# Patient Record
Sex: Female | Born: 1985 | Race: White | Hispanic: No | Marital: Single | Smoking: Current every day smoker
Health system: Southern US, Community
[De-identification: ages and names within clinical notes are randomized; demographics above are authoritative.]

## PROBLEM LIST (undated history)

## (undated) DIAGNOSIS — N200 Calculus of kidney: Secondary | ICD-10-CM

## (undated) DIAGNOSIS — J020 Streptococcal pharyngitis: Secondary | ICD-10-CM

## (undated) HISTORY — PX: WISDOM TOOTH EXTRACTION: SHX21

---

## 2006-02-06 ENCOUNTER — Emergency Department (HOSPITAL_COMMUNITY): Admission: EM | Admit: 2006-02-06 | Discharge: 2006-02-06 | Payer: Self-pay | Admitting: Emergency Medicine

## 2014-03-17 ENCOUNTER — Emergency Department (HOSPITAL_COMMUNITY)
Admission: EM | Admit: 2014-03-17 | Discharge: 2014-03-17 | Disposition: A | Discharge status: Home / Self Care | Payer: Medicaid Other | Attending: Emergency Medicine | Admitting: Emergency Medicine

## 2014-03-17 ENCOUNTER — Encounter (HOSPITAL_COMMUNITY): Payer: Self-pay | Admitting: Emergency Medicine

## 2014-03-17 DIAGNOSIS — Z79899 Other long term (current) drug therapy: Secondary | ICD-10-CM | POA: Insufficient documentation

## 2014-03-17 DIAGNOSIS — Z720 Tobacco use: Secondary | ICD-10-CM | POA: Insufficient documentation

## 2014-03-17 DIAGNOSIS — J029 Acute pharyngitis, unspecified: Secondary | ICD-10-CM | POA: Insufficient documentation

## 2014-03-17 DIAGNOSIS — J020 Streptococcal pharyngitis: Secondary | ICD-10-CM

## 2014-03-17 HISTORY — DX: Streptococcal pharyngitis: J02.0

## 2014-03-17 LAB — RAPID STREP SCREEN (MED CTR MEBANE ONLY): STREPTOCOCCUS, GROUP A SCREEN (DIRECT): NEGATIVE

## 2014-03-17 LAB — MONONUCLEOSIS SCREEN: Mono Screen: NEGATIVE

## 2014-03-17 MED ORDER — IBUPROFEN 100 MG/5ML PO SUSP
ORAL | Status: AC
  Filled 2014-03-17: qty 25

## 2014-03-17 MED ORDER — HYDROCODONE-ACETAMINOPHEN 5-325 MG PO TABS: 1.0000 | ORAL_TABLET | ORAL | Status: DC | PRN

## 2014-03-17 MED ORDER — IBUPROFEN 100 MG/5ML PO SUSP
500.0000 mg | Freq: Once | ORAL | Status: AC
  Administered 2014-03-17: 500 mg via ORAL

## 2014-03-17 NOTE — Discharge Instructions (Signed)
Strep Throat °Strep throat is an infection of the throat caused by a bacteria named Streptococcus pyogenes. Your health care provider may call the infection streptococcal "tonsillitis" or "pharyngitis" depending on whether there are signs of inflammation in the tonsils or back of the throat. Strep throat is most common in children aged 29-15 years during the cold months of the year, but it can occur in people of any age during any season. This infection is spread from person to person (contagious) through coughing, sneezing, or other close contact. °SIGNS AND SYMPTOMS  °· Fever or chills. °· Painful, swollen, red tonsils or throat. °· Pain or difficulty when swallowing. °· White or yellow spots on the tonsils or throat. °· Swollen, tender lymph nodes or "glands" of the neck or under the jaw. °· Red rash all over the body (rare). °DIAGNOSIS  °Many different infections can cause the same symptoms. A test must be done to confirm the diagnosis so the right treatment can be given. A "rapid strep test" can help your health care provider make the diagnosis in a few minutes. If this test is not available, a light swab of the infected area can be used for a throat culture test. If a throat culture test is done, results are usually available in a day or two. °TREATMENT  °Strep throat is treated with antibiotic medicine. °HOME CARE INSTRUCTIONS  °· Gargle with 1 tsp of salt in 1 cup of warm water, 3-4 times per day or as needed for comfort. °· Family members who also have a sore throat or fever should be tested for strep throat and treated with antibiotics if they have the strep infection. °· Make sure everyone in your household washes their hands well. °· Do not share food, drinking cups, or personal items that could cause the infection to spread to others. °· You may need to eat a soft food diet until your sore throat gets better. °· Drink enough water and fluids to keep your urine clear or pale yellow. This will help prevent  dehydration. °· Get plenty of rest. °· Stay home from school, day care, or work until you have been on antibiotics for 24 hours. °· Take medicines only as directed by your health care provider. °· Take your antibiotic medicine as directed by your health care provider. Finish it even if you start to feel better. °SEEK MEDICAL CARE IF:  °· The glands in your neck continue to enlarge. °· You develop a rash, cough, or earache. °· You cough up green, yellow-brown, or bloody sputum. °· You have pain or discomfort not controlled by medicines. °· Your problems seem to be getting worse rather than better. °· You have a fever. °SEEK IMMEDIATE MEDICAL CARE IF:  °· You develop any new symptoms such as vomiting, severe headache, stiff or painful neck, chest pain, shortness of breath, or trouble swallowing. °· You develop severe throat pain, drooling, or changes in your voice. °· You develop swelling of the neck, or the skin on the neck becomes red and tender. °· You develop signs of dehydration, such as fatigue, dry mouth, and decreased urination. °· You become increasingly sleepy, or you cannot wake up completely. °MAKE SURE YOU: °· Understand these instructions. °· Will watch your condition. °· Will get help right away if you are not doing well or get worse. °Document Released: 02/27/2000 Document Revised: 07/16/2013 Document Reviewed: 04/30/2010 °[employer]® Patient Information ©2015 [employer]. This information is not intended to replace advice given to you by   your health care provider. Make sure you discuss any questions you have with your health care provider.   Finish your antibiotic as discussed as I suspect you do have strep throat and the antibiotic is working as your rapid strep screen today is negative.  Your exam findings strongly suggest that this is strep.  You may use the medication prescribed to help you with throat pain.  Do not drive within 4 hours of taking this as it will make you drowsy.  It also  contains Tylenol so do not take any additional Tylenol while taking this pain medication.  Rest and make sure you is drinking plenty of fluids get rechecked if you are not feeling improved over the next several days or for any worsened symptoms.

## 2014-03-17 NOTE — ED Notes (Signed)
Patient c/o severe sore throat. Per patient can't eat or drink due to the pain. Patient also reports some body aches and fevers. Hx of multiple strep infections. Patient seen at [facility] 4 days ago, was not swabbed for strep but given antibiotics-amoxicillin . Patient also reports taking extra strength tylenol with no relief.

## 2014-03-17 NOTE — ED Provider Notes (Signed)
CSN: [id number]     Arrival date & time 03/17/14  1258 History  This chart was scribed for non-physician practitioner, Burgess Amor, PA-C working with Doug Sou, MD by Gwenyth Ober, ED scribe. This patient was seen in room APFT21/APFT21 and the patient's care was started at 1:46 PM   Chief Complaint  Patient presents with  . Sore Throat   The history is provided by the patient. No language interpreter was used.    HPI Comments: Kristy Stokes is a 29 y.o. female who presents to the Emergency Department complaining of constant sore throat that started 1 week ago. She states fever of 100F, non productive cough  as associated symptoms. Pt also notes difficulty swallowing and states she has not eaten solid food in the last 3 days. Pt has history of strep throat. She went to [facility] 4 days ago, but did not have a strep test. Pt was prescribed Amoxicillin-500 mg, currently on day 3  with no relief. She also tried chloraseptic Spray and Tylenol for her symptoms. Pt's last dose of Tylenol was last night. Pt denies positive sick contact.  She denies nausea, vomiting, nasal congestion, ear pain and abdominal pain as associated symptoms.  No PCP  Past Medical History  Diagnosis Date  . Strep throat    History reviewed. No pertinent past surgical history. History reviewed. No pertinent family history. History  Substance Use Topics  . Smoking status: Current Every Day Smoker -- 1.00 packs/day for 10 years    Types: Cigarettes  . Smokeless tobacco: Never Used  . Alcohol Use: No   OB History    Gravida Para Term Preterm AB TAB SAB Ectopic Multiple Living   Review of Systems  Constitutional: Positive for fever and chills.  HENT: Positive for sore throat. Negative for congestion, ear pain, rhinorrhea, sinus pressure, trouble swallowing and voice change.   Eyes: Negative for discharge.  Respiratory: Positive for cough. Negative for shortness of breath, wheezing and stridor.    Cardiovascular: Negative for chest pain.  Gastrointestinal: Negative for nausea, vomiting and abdominal pain.  Genitourinary: Negative.   All other systems reviewed and are negative.   Allergies  Review of patient's allergies indicates no known allergies.  Home Medications   Prior to Admission medications   Medication Sig Start Date End Date Taking? Authorizing Provider  acetaminophen (TYLENOL) 500 MG tablet Take 1,500 mg by mouth every 6 (six) hours as needed for moderate pain.   Yes Historical Provider, MD  Cyanocobalamin (VITAMIN B 12 PO) Take 1 tablet by mouth daily.   Yes Historical Provider, MD  HYDROcodone-acetaminophen (NORCO/VICODIN) 5-325 MG per tablet Take 1 tablet by mouth every 4 (four) hours as needed for moderate pain. 03/17/14   Burgess Amor, PA-C   BP 118/76 mmHg  Pulse 97  Temp(Src) 100.5 F (38.1 C) (Oral)  Resp 24  Ht  (1.6 m)  Wt 121 lb (54.885 kg)  BMI 21.44 kg/m2  SpO2 100% Physical Exam  Constitutional: She is oriented to person, place, and time. She appears well-developed and well-nourished.  HENT:  Head: Normocephalic and atraumatic.  Right Ear: Tympanic membrane and ear canal normal.  Left Ear: Tympanic membrane and ear canal normal.  Nose: Mucosal edema and rhinorrhea present.  Mouth/Throat: Uvula is midline, oropharynx is clear and moist and mucous membranes are normal. No oropharyngeal exudate, posterior oropharyngeal edema, posterior oropharyngeal erythema or tonsillar abscesses.  1+  tonsills bilaterally; symmetric without abscess; erythema and moderate exudate; uvula midline; bilateral tonsillar adenopathy; TMs normal; no nasal congestion  Eyes: Conjunctivae are normal.  Neck: Phonation normal.  Cardiovascular: Normal rate and normal heart sounds.   Pulmonary/Chest: Effort normal. No stridor. No respiratory distress. She has no decreased breath sounds. She has no wheezes. She has no rhonchi. She has no rales.  Lungs clear  Abdominal: Soft.  There is no tenderness.  No hepatosplenomegaly  Musculoskeletal: Normal range of motion.  Neurological: She is alert and oriented to person, place, and time.  Skin: Skin is warm and dry. No rash noted.  Psychiatric: She has a normal mood and affect.  Nursing note and vitals reviewed.   ED Course  Procedures (including critical care time) DIAGNOSTIC STUDIES: Oxygen Saturation is 100% on RA, normal by my interpretation.    COORDINATION OF CARE: 1:55 PM Discussed treatment plan with pt at bedside and pt agreed to plan.    Labs Review Labs Reviewed  RAPID STREP SCREEN  CULTURE, GROUP A STREP  MONONUCLEOSIS SCREEN    Imaging Review No results found.   EKG Interpretation None      MDM   Final diagnoses:  Strep throat   Patients labs and/or radiological studies were viewed and considered during the medical decision making and disposition process.  Strep and mono tests negative. No tonsillar abscess, lungs clear, no stridor.  Pt stable.  Suspect this is strep throat which is responding to amoxil, given negative rapid strep. Mono negative.  encouraged continue abx, rest, increased fluid intake, hydrocodone for throat pain prescribed.  She tolerated PO fluids while here.   I personally performed the services described in this documentation, which was scribed in my presence. The recorded information has been reviewed and is accurate.   Burgess Amor, PA-C 03/18/14 1350  Doug Sou, MD 03/18/14 1444

## 2014-03-17 NOTE — ED Notes (Signed)
Pt verbalized understanding of no driving and to use caution within 4 hours of taking pain meds due to meds cause drowsiness 

## 2014-03-19 LAB — CULTURE, GROUP A STREP

## 2014-11-14 ENCOUNTER — Emergency Department (HOSPITAL_COMMUNITY): Payer: Medicaid Other

## 2014-11-14 ENCOUNTER — Emergency Department (HOSPITAL_COMMUNITY)
Admission: EM | Admit: 2014-11-14 | Discharge: 2014-11-14 | Disposition: A | Discharge status: Home / Self Care | Payer: Medicaid Other | Attending: Emergency Medicine | Admitting: Emergency Medicine

## 2014-11-14 ENCOUNTER — Encounter (HOSPITAL_COMMUNITY): Payer: Self-pay | Admitting: *Deleted

## 2014-11-14 DIAGNOSIS — Z720 Tobacco use: Secondary | ICD-10-CM | POA: Insufficient documentation

## 2014-11-14 DIAGNOSIS — Y998 Other external cause status: Secondary | ICD-10-CM | POA: Insufficient documentation

## 2014-11-14 DIAGNOSIS — Z8709 Personal history of other diseases of the respiratory system: Secondary | ICD-10-CM | POA: Diagnosis not present

## 2014-11-14 DIAGNOSIS — Z79899 Other long term (current) drug therapy: Secondary | ICD-10-CM | POA: Diagnosis not present

## 2014-11-14 DIAGNOSIS — S99922A Unspecified injury of left foot, initial encounter: Secondary | ICD-10-CM | POA: Diagnosis present

## 2014-11-14 DIAGNOSIS — W228XXA Striking against or struck by other objects, initial encounter: Secondary | ICD-10-CM | POA: Diagnosis not present

## 2014-11-14 DIAGNOSIS — Y9389 Activity, other specified: Secondary | ICD-10-CM | POA: Diagnosis not present

## 2014-11-14 DIAGNOSIS — S92415A Nondisplaced fracture of proximal phalanx of left great toe, initial encounter for closed fracture: Secondary | ICD-10-CM | POA: Diagnosis not present

## 2014-11-14 DIAGNOSIS — Y9289 Other specified places as the place of occurrence of the external cause: Secondary | ICD-10-CM | POA: Diagnosis not present

## 2014-11-14 DIAGNOSIS — S92912B Unspecified fracture of left toe(s), initial encounter for open fracture: Secondary | ICD-10-CM

## 2014-11-14 MED ORDER — IBUPROFEN 800 MG PO TABS
800.0000 mg | ORAL_TABLET | Freq: Once | ORAL | Status: DC
  Filled 2014-11-14: qty 1

## 2014-11-14 MED ORDER — IBUPROFEN 600 MG PO TABS: 600.0000 mg | ORAL_TABLET | Freq: Four times a day (QID) | ORAL | Status: DC | PRN

## 2014-11-14 MED ORDER — ACETAMINOPHEN 325 MG PO TABS
650.0000 mg | ORAL_TABLET | Freq: Once | ORAL | Status: AC
  Administered 2014-11-14: 650 mg via ORAL
  Filled 2014-11-14: qty 2

## 2014-11-14 MED ORDER — HYDROCODONE-ACETAMINOPHEN 5-325 MG PO TABS: 1.0000 | ORAL_TABLET | ORAL | Status: DC | PRN

## 2014-11-14 NOTE — ED Provider Notes (Signed)
CSN: [id number]     Arrival date & time 11/14/14  1352 History   First MD Initiated Contact with Patient 11/14/14 1441     Chief Complaint  Patient presents with  . Toe Injury     (Consider location/radiation/quality/duration/timing/severity/associated sxs/prior Treatment) HPI Comments: Patient is a 29 year old female who presents to the emergency department with complaint of left foot pain. The patient states that on Monday, August 29 she was trying to kick a basket of close and she missed an accidentally hit a wall. She heard a crack, and had immediate pain and swelling of her left first toe. She has had increasing pain and bruising since that time. The patient has tried conservative measures including elevation and some ice. She states the problem seems to be getting worse instead of better. It is really uncomfortable when she has to walk or stay in. She presents now for evaluation concerning this injury.  The history is provided by the patient.    Past Medical History  Diagnosis Date  . Strep throat    History reviewed. No pertinent past surgical history. No family history on file. Social History  Substance Use Topics  . Smoking status: Current Every Day Smoker -- 1.00 packs/day for 10 years    Types: Cigarettes  . Smokeless tobacco: Never Used  . Alcohol Use: No   OB History    Gravida Para Term Preterm AB TAB SAB Ectopic Multiple Living   1 1 1       1      Review of Systems  Constitutional: Negative for activity change.       All ROS Neg except as noted in HPI  HENT: Negative for nosebleeds.   Eyes: Negative for photophobia and discharge.  Respiratory: Negative for cough, shortness of breath and wheezing.   Cardiovascular: Negative for chest pain and palpitations.  Gastrointestinal: Negative for abdominal pain and blood in stool.  Genitourinary: Negative for dysuria, frequency and hematuria.  Musculoskeletal: Negative for back pain, arthralgias and neck pain.  Skin:  Negative.   Neurological: Negative for dizziness, seizures and speech difficulty.  Psychiatric/Behavioral: Negative for hallucinations and confusion.      Allergies  Review of patient's allergies indicates no known allergies.  Home Medications   Prior to Admission medications   Medication Sig Start Date End Date Taking? Authorizing Provider  diclofenac (VOLTAREN) 75 MG EC tablet Take 75 mg by mouth 2 (two) times daily.   Yes Historical Provider, MD  gabapentin (NEURONTIN) 300 MG capsule Take 300 mg by mouth 3 (three) times daily.   Yes Historical Provider, MD  acetaminophen (TYLENOL) 500 MG tablet Take 1,500 mg by mouth every 6 (six) hours as needed for moderate pain.    Historical Provider, MD  HYDROcodone-acetaminophen (NORCO/VICODIN) 5-325 MG per tablet Take 1 tablet by mouth every 4 (four) hours as needed for moderate pain. Patient not taking: Reported on 11/14/2014 03/17/14   Burgess Amor, PA-C   BP 121/82 mmHg  Pulse 66  Temp(Src) 97.8 F (36.6 C) (Oral)  Resp 16  Ht 5\' 3"  (1.6 m)  Wt 121 lb (54.885 kg)  BMI 21.44 kg/m2  SpO2 100%  LMP 11/14/2014 Physical Exam  Constitutional: She is oriented to person, place, and time. She appears well-developed and well-nourished.  Non-toxic appearance.  HENT:  Head: Normocephalic.  Right Ear: Tympanic membrane and external ear normal.  Left Ear: Tympanic membrane and external ear normal.  Eyes: EOM and lids are normal. Pupils are equal, round, and reactive  to light.  Neck: Normal range of motion. Neck supple. Carotid bruit is not present.  Cardiovascular: Normal rate, regular rhythm, normal heart sounds, intact distal pulses and normal pulses.   Pulmonary/Chest: Breath sounds normal. No respiratory distress.  Abdominal: Soft. Bowel sounds are normal. There is no tenderness. There is no guarding.  Musculoskeletal: Normal range of motion.  There is pain and swelling of the left first toe. Pain is worse at the proximal phalanx. The Achilles  tendon is intact. The dorsalis pedis pulses 2+. Is full range of motion of the left knee and hip.  Lymphadenopathy:       Head (right side): No submandibular adenopathy present.       Head (left side): No submandibular adenopathy present.    She has no cervical adenopathy.  Neurological: She is alert and oriented to person, place, and time. She has normal strength. No cranial nerve deficit or sensory deficit.  Skin: Skin is warm and dry.  Psychiatric: She has a normal mood and affect. Her speech is normal.  Nursing note and vitals reviewed.   ED Course  Procedures (including critical care time) Labs Review Labs Reviewed - No data to display  Imaging Review Dg Foot Complete Left  11/14/2014   CLINICAL DATA:  Stubbing injury left great toe against a wall on Monday.  EXAM: LEFT FOOT - COMPLETE 3+ VIEW  COMPARISON:  None.  FINDINGS: Subtle linear lucency along the medial base of the proximal phalanx great toe is present with some adjacent soft tissue swelling. The possibility of a nondisplaced fracture is raised. This lucency is not visible on the other two views.  Lisfranc joint alignment normal.  No other significant findings.  IMPRESSION: 1. Linear lucency along the medial base of the proximal phalanx great toe. This could be a secondary sign of nondisplaced fracture, but is not apparent on the other projections. Correlate with physical exam; if this spot is point tender then the likelihood of a nondisplaced fracture is increased.   Electronically Signed   By: Gaylyn Rong M.D.   On: 11/14/2014 14:29   I have personally reviewed and evaluated these images and lab results as part of my medical decision-making.   EKG Interpretation None      MDM  X-ray of the left foot reveals a lucent area along the medial base of the proximal phalanx of the great toe. There is question as to whether this could be a fracture.  The left first toe was examined, and this is also the area of greatest  pain. The patient will be treated as a fracture. The patient is referred to podiatry. The patient will be treated medically with Norco and ibuprofen.    Final diagnoses:  None    **I have reviewed nursing notes, vital signs, and all appropriate lab and imaging results for this patient.Ivery Quale, PA-C 11/14/14 1514  Benjiman Core, MD 11/15/14 1158

## 2014-11-14 NOTE — Discharge Instructions (Signed)
Please see the podiatrist (foot specialist) as soon as possible Toe Fracture A toe fracture is a break in the bone of a toe. It may take 6 to 8 weeks for the toe injury to heal. HOME CARE  "Buddy taping" is taping the broken toe to the toe next to it. Leave the toes taped together for at least 1 week or as told by your doctor. Change the tape after bathing. Always use a small piece of gauze or cotton between the toes when taping them together.  Put ice on the injured area.  Put ice in a plastic bag.  Place a towel between your skin and the bag.  Leave the ice on for 15-20 minutes, 03-04 times a day.  After the first 2 days, put heat on the injured area. Use heat for the next 2 to 3 days. Put a heating pad on the foot or soak the foot in warm water as told by your doctor.  Keep the foot raised (elevated) above the level of your heart.  Wear sturdy, supportive shoes. The shoes should not pinch the toes or fit tightly against the toes.  Use a cast shoe (if prescribed) if the foot is very puffy (swollen).  Use crutches if you have pain or it hurts too much to walk.  Only take medicine as told by your doctor.  Follow up with your doctor as told. GET HELP RIGHT AWAY IF:   There is pain or puffiness that is not helped by medicine.  The pain does not get better after 1 week.  The toe is cold when the others are warm.  The toe loses feeling (numb) or turns white.  The toe becomes hot and red (inflamed). MAKE SURE YOU:   Understand these instructions.  Will watch this condition.  Will get help right away if you are not doing well or get worse. Document Released: 08/18/2007 Document Revised: 05/24/2011 Document Reviewed: 07/25/2009 [facility] Patient Information 2015 [place], [place]. This information is not intended to replace advice given to you by your health care provider. Make sure you discuss any questions you have with your health care provider.

## 2014-11-14 NOTE — ED Notes (Signed)
Pt states she hit her left great toe against the wall on Monday. NAD.

## 2015-02-19 ENCOUNTER — Encounter (HOSPITAL_COMMUNITY): Payer: Self-pay | Admitting: Emergency Medicine

## 2015-02-19 ENCOUNTER — Emergency Department (HOSPITAL_COMMUNITY)
Admission: EM | Admit: 2015-02-19 | Discharge: 2015-02-20 | Discharge status: Against Medical Advice | Payer: Medicaid Other | Attending: Emergency Medicine | Admitting: Emergency Medicine

## 2015-02-19 DIAGNOSIS — Z3202 Encounter for pregnancy test, result negative: Secondary | ICD-10-CM | POA: Diagnosis not present

## 2015-02-19 DIAGNOSIS — Z8709 Personal history of other diseases of the respiratory system: Secondary | ICD-10-CM | POA: Insufficient documentation

## 2015-02-19 DIAGNOSIS — Z79899 Other long term (current) drug therapy: Secondary | ICD-10-CM | POA: Diagnosis not present

## 2015-02-19 DIAGNOSIS — F17210 Nicotine dependence, cigarettes, uncomplicated: Secondary | ICD-10-CM | POA: Diagnosis not present

## 2015-02-19 DIAGNOSIS — M545 Low back pain: Secondary | ICD-10-CM | POA: Diagnosis not present

## 2015-02-19 DIAGNOSIS — Z791 Long term (current) use of non-steroidal anti-inflammatories (NSAID): Secondary | ICD-10-CM | POA: Diagnosis not present

## 2015-02-19 DIAGNOSIS — R339 Retention of urine, unspecified: Secondary | ICD-10-CM | POA: Diagnosis not present

## 2015-02-19 DIAGNOSIS — R32 Unspecified urinary incontinence: Secondary | ICD-10-CM | POA: Diagnosis not present

## 2015-02-19 DIAGNOSIS — M549 Dorsalgia, unspecified: Secondary | ICD-10-CM

## 2015-02-19 DIAGNOSIS — R109 Unspecified abdominal pain: Secondary | ICD-10-CM | POA: Diagnosis present

## 2015-02-19 LAB — COMPREHENSIVE METABOLIC PANEL
ALBUMIN: 4.2 g/dL (ref 3.5–5.0)
ALK PHOS: 55 U/L (ref 38–126)
ALT: 36 U/L (ref 14–54)
AST: 32 U/L (ref 15–41)
Anion gap: 6 (ref 5–15)
BILIRUBIN TOTAL: 0.4 mg/dL (ref 0.3–1.2)
BUN: 11 mg/dL (ref 6–20)
CALCIUM: 9.2 mg/dL (ref 8.9–10.3)
CO2: 30 mmol/L (ref 22–32)
Chloride: 102 mmol/L (ref 101–111)
Creatinine, Ser: 0.7 mg/dL (ref 0.44–1.00)
GFR calc Af Amer: >60 mL/min (ref >60)
GLUCOSE: 95 mg/dL (ref 65–99)
POTASSIUM: 3.3 mmol/L — AB (ref 3.5–5.1)
Sodium: 138 mmol/L (ref 135–145)
TOTAL PROTEIN: 7.4 g/dL (ref 6.5–8.1)

## 2015-02-19 LAB — URINALYSIS, ROUTINE W REFLEX MICROSCOPIC
BILIRUBIN URINE: NEGATIVE
GLUCOSE, UA: NEGATIVE mg/dL
Hgb urine dipstick: NEGATIVE
KETONES UR: NEGATIVE mg/dL
LEUKOCYTES UA: NEGATIVE
Nitrite: NEGATIVE
PH: 6 (ref 5.0–8.0)
Protein, ur: NEGATIVE mg/dL

## 2015-02-19 LAB — CBC
HEMATOCRIT: 40.4 % (ref 36.0–46.0)
Hemoglobin: 14.3 g/dL (ref 12.0–15.0)
MCH: 31.5 pg (ref 26.0–34.0)
MCHC: 35.4 g/dL (ref 30.0–36.0)
MCV: 89 fL (ref 78.0–100.0)
PLATELETS: 163 10*3/uL (ref 150–400)
RBC: 4.54 MIL/uL (ref 3.87–5.11)
RDW: 12.2 % (ref 11.5–15.5)
WBC: 6.1 10*3/uL (ref 4.0–10.5)

## 2015-02-19 LAB — PREGNANCY, URINE: PREG TEST UR: NEGATIVE

## 2015-02-19 MED ORDER — POTASSIUM CHLORIDE CRYS ER 20 MEQ PO TBCR
40.0000 meq | EXTENDED_RELEASE_TABLET | Freq: Once | ORAL | Status: AC
  Administered 2015-02-20: 40 meq via ORAL
  Filled 2015-02-19: qty 2

## 2015-02-19 MED ORDER — HYDROCODONE-ACETAMINOPHEN 5-325 MG PO TABS: 1.0000 | ORAL_TABLET | ORAL | Status: DC | PRN

## 2015-02-19 MED ORDER — IBUPROFEN 800 MG PO TABS
800.0000 mg | ORAL_TABLET | Freq: Once | ORAL | Status: AC
  Administered 2015-02-20: 800 mg via ORAL
  Filled 2015-02-19: qty 1

## 2015-02-19 NOTE — ED Provider Notes (Signed)
By signing my name below, I, Soijett Blue, attest that this documentation has been prepared under the direction and in the presence of Enbridge EnergyKristen N Butler Vegh, DO. Electronically Signed: Soijett Blue, ED Scribe. 02/19/2015. 11:40 PM.  TIME SEEN: 11:19 PM  CHIEF COMPLAINT: Flank pain  HPI: HPI Comments: Kristy Stokes is a 29 y.o. female who presents to the Emergency Department complaining of throbbing/aching, intermittent lower back pain for the past 3-4 weeks. She states that pain was initially bilateral cross the lower back and worse with movement. Now it seems more located in the right lower back. She states she cannot pick up her son because of pain and has a hard time twisting. No known injury that she can remember. She also reports that symptoms started after she had a tooth pulled and was taking penicillin, ibuprofen and Vicodin for pain. States that she was "taking so much medication" that caused her to have urinary retention. She also reports she has had several episodes of urinary incontinence. No bowel incontinence. She has had numbness for the past several days along the right lateral thigh but no saddle anesthesia. Note weakness in her lower extremities. No prior history of back surgery, epidural injections. No fever. She is not diabetic or immunocompromised. Denies any IV drug use. No history of kidney stones. Denies any dysuria, hematuria, vaginal bleeding or discharge. She denies being on any other medications that may lead to urinary retention.   ROS: See HPI Constitutional: no fever  Eyes: no drainage  ENT: no runny nose   Cardiovascular:  no chest pain  Resp: no SOB  GI: no vomiting GU: no dysuria Integumentary: no rash  Allergy: no hives  Musculoskeletal: no leg swelling  Neurological: no slurred speech ROS otherwise negative  PAST MEDICAL HISTORY/PAST SURGICAL HISTORY:  Past Medical History  Diagnosis Date  . Strep throat     MEDICATIONS:  Prior to Admission medications    Medication Sig Start Date End Date Taking? Authorizing Provider  etonogestrel (NEXPLANON) 68 MG IMPL implant 1 each by Subdermal route once.   Yes Historical Provider, MD  acetaminophen (TYLENOL) 500 MG tablet Take 1,500 mg by mouth every 6 (six) hours as needed for moderate pain.    Historical Provider, MD  diclofenac (VOLTAREN) 75 MG EC tablet Take 75 mg by mouth 2 (two) times daily.    Historical Provider, MD  gabapentin (NEURONTIN) 300 MG capsule Take 300 mg by mouth 3 (three) times daily.    Historical Provider, MD  HYDROcodone-acetaminophen (NORCO/VICODIN) 5-325 MG per tablet Take 1 tablet by mouth every 4 (four) hours as needed. Patient not taking: Reported on 02/19/2015 11/14/14   Ivery QualeHobson Bryant, PA-C  ibuprofen (ADVIL,MOTRIN) 600 MG tablet Take 1 tablet (600 mg total) by mouth every 6 (six) hours as needed. Patient not taking: Reported on 02/19/2015 11/14/14   Ivery QualeHobson Bryant, PA-C    ALLERGIES:  No Known Allergies  SOCIAL HISTORY:  Social History  Substance Use Topics  . Smoking status: Current Every Day Smoker -- 1.00 packs/day for 10 years    Types: Cigarettes  . Smokeless tobacco: Never Used  . Alcohol Use: No    FAMILY HISTORY: History reviewed. No pertinent family history.  EXAM: BP 126/89 mmHg  Pulse 76  Temp(Src) 98 F (36.7 C) (Oral)  Resp 14  Ht 5\' 3"  (1.6 m)  Wt 130 lb (58.968 kg)  BMI 23.03 kg/m2  SpO2 99% CONSTITUTIONAL: Alert and oriented and responds appropriately to questions. Well-appearing; well-nourished, afebrile, nontoxic but  does appear uncomfortable  HEAD: Normocephalic EYES: Conjunctivae clear, PERRL ENT: normal nose; no rhinorrhea; moist mucous membranes; pharynx without lesions noted NECK: Supple, no meningismus, no LAD  CARD: RRR; S1 and S2 appreciated; no murmurs, no clicks, no rubs, no gallops RESP: Normal chest excursion without splinting or tachypnea; breath sounds clear and equal bilaterally; no wheezes, no rhonchi, no rales,  no hypoxia,  speaking full sentences  ABD/GI: Normal bowel sounds; non-distended; soft, non-tender, no rebound, no guarding BACK:  The back appears normal and itender throughout the lumbar paraspinal musculature and over the lower lumbar spine without step-off or deformity, no CVA tenderness on exam EXT: Normal ROM in all joints; non-tender to palpation; no edema; normal capillary refill; no cyanosis    SKIN: Normal color for age and race; warm NEURO: Moves all extremities equally, strength 5/5 in all 4 extremities, 2+ deep tendon reflexes in bilateral lower extremity is, no clonus, normal gait, no saddle anesthesia, patient does report decreased sensation to the right lateral thigh from the hip to the knee compared to the left side but otherwise normal sensation diffusely  PSYCH: The patient's mood and manner are appropriate. Grooming and personal hygiene are appropriate.  MEDICAL DECISION MAKING:Patient here with complaints of back pain. She briefly mentions urinary retention and urinary incontinence but states she thinks this was medication related. Discussed with patient that I do not think that ibuprofen, Vicodin or penicillin have been causing her urinary retention especially if and tender still present and she is no longer taking these medications. She also reports several episodes of urinary incontinence. I'm concerned that she could have caudae equina and have recommended that she be transported to [facility] for an emergent MRI. She is here with her young son and states that no one can calm pick him up and she cannot have this test performed tonight. She has had labs that show no abnormality other than mildly decreased potassium at 3.3. This has been replaced. Urine shows no bladder sign of infection. She is not pregnant. I do not think this is a UTI or kidney stone causing her symptoms. Discussed with patient that it is very concerning to have urinary retention and incontinence in the setting of back  pain that is new. She again states that she cannot stay for an MRI but will try to come back either to [facility] or [facility] tomorrow for an MRI of her lumbar spine. I think this can be performed without contrast. Have low suspicion for epidural abscess, discitis or osteomyelitis. Because patient refuses to have this imaging done today and I think this is emergent she will sign out AGAINST MEDICAL ADVICE. She is not intoxicated, is oriented 3 and has capacity to make this decision. She is able to verbalize risks back to me. We'll give her a dose of ibuprofen in the emergency department and discharge with prescription for 6 Vicodin that she can have pain control overnight but have discussed with her again at length that she needs to return to the hospital as soon as possible for an MRI as symptoms could worsen and could lead to persistent neurologic deficit, disability if she does have cauda equina, spinal stenosis. She verbalized understanding and is comfortable with this plan.  I personally performed the services described in this documentation, which was scribed in my presence. The recorded information has been reviewed and is accurate.    Layla Maw Locklyn Henriquez, DO 02/20/15 [phone]

## 2015-02-19 NOTE — Discharge Instructions (Signed)
You were seen in the ED for back pain, urinary incontinence and urinary retention.  These symptoms are very concerning for a surgical emergency called cauda equina.  You have decided to leave the ED without a MRI of your lumbar spine.  I recommend that you return ASAP for this test.  If you develop worsening symptoms or change your mind and want further evaluation, we are happy to take care of you.   Acute Urinary Retention, Female Acute urinary retention is the temporary inability to urinate. This is an uncommon problem in women. It can be caused by:  Infection.  A side effect of a medicine.  A problem in a nearby organ that presses or squeezes on the bladder or the urethra (the tube that drains the bladder).  Psychological problems.   Surgery on your bladder, urethra, or pelvic organs that causes obstruction to the outflow of urine from your bladder. HOME CARE INSTRUCTIONS  If you are sent home with a Foley catheter and a drainage system, you will need to discuss the best course of action with your health care provider. While the catheter is in, maintain a good intake of fluids. Keep the drainage bag emptied and lower than your catheter. This is so that contaminated urine will not flow back into your bladder, which could lead to a urinary tract infection. There are two main types of drainage bags. One is a large bag that usually is used at night. It has a good capacity that will allow you to sleep through the night without having to empty it. The second type is called a leg bag. It has a smaller capacity so it needs to be emptied more frequently. However, the main advantage is that it can be attached by a leg strap and goes underneath your clothing, allowing you the freedom to move about or leave your home. Only take over-the-counter or prescription medicines for pain, discomfort, or fever as directed by your health care provider.  SEEK MEDICAL CARE IF:  You develop a low-grade fever.  You  experience spasms or leakage of urine with the spasms. SEEK IMMEDIATE MEDICAL CARE IF:   You develop chills or fever.  Your catheter stops draining urine.  Your catheter falls out.  You start to develop increased bleeding that does not respond to rest and increased fluid intake. MAKE SURE YOU:  Understand these instructions.  Will watch your condition.  Will get help right away if you are not doing well or get worse.   This information is not intended to replace advice given to you by your health care provider. Make sure you discuss any questions you have with your health care provider.   Document Released: 02/28/2006 Document Revised: 07/16/2014 Document Reviewed: 08/10/2012 Elsevier Interactive Patient Education 2016 Elsevier Inc[employer]  Back Pain, Adult Back pain is very common in adults.The cause of back pain is rarely dangerous and the pain often gets better over time.The cause of your back pain may not be known. Some common causes of back pain include:  Strain of the muscles or ligaments supporting the spine.  Wear and tear (degeneration) of the spinal disks.  Arthritis.  Direct injury to the back. For many people, back pain may return. Since back pain is rarely dangerous, most people can learn to manage this condition on their own. HOME CARE INSTRUCTIONS Watch your back pain for any changes. The following actions may help to lessen any discomfort you are feeling:  Remain active. It is stressful on your  back to sit or stand in one place for long periods of time. Do not sit, drive, or stand in one place for more than 30 minutes at a time. Take short walks on even surfaces as soon as you are able.Try to increase the length of time you walk each day.  Exercise regularly as directed by your health care provider. Exercise helps your back heal faster. It also helps avoid future injury by keeping your muscles strong and flexible.  Do not stay in bed.Resting more than 1-2 days  can delay your recovery.  Pay attention to your body when you bend and lift. The most comfortable positions are those that put less stress on your recovering back. Always use proper lifting techniques, including:  Bending your knees.  Keeping the load close to your body.  Avoiding twisting.  Find a comfortable position to sleep. Use a firm mattress and lie on your side with your knees slightly bent. If you lie on your back, put a pillow under your knees.  Avoid feeling anxious or stressed.Stress increases muscle tension and can worsen back pain.It is important to recognize when you are anxious or stressed and learn ways to manage it, such as with exercise.  Take medicines only as directed by your health care provider. Over-the-counter medicines to reduce pain and inflammation are often the most helpful.Your health care provider may prescribe muscle relaxant drugs.These medicines help dull your pain so you can more quickly return to your normal activities and healthy exercise.  Apply ice to the injured area:  Put ice in a plastic bag.  Place a towel between your skin and the bag.  Leave the ice on for 20 minutes, 2-3 times a day for the first 2-3 days. After that, ice and heat may be alternated to reduce pain and spasms.  Maintain a healthy weight. Excess weight puts extra stress on your back and makes it difficult to maintain good posture. SEEK MEDICAL CARE IF:  You have pain that is not relieved with rest or medicine.  You have increasing pain going down into the legs or buttocks.  You have pain that does not improve in one week.  You have night pain.  You lose weight.  You have a fever or chills. SEEK IMMEDIATE MEDICAL CARE IF:   You develop new bowel or bladder control problems.  You have unusual weakness or numbness in your arms or legs.  You develop nausea or vomiting.  You develop abdominal pain.  You feel faint.   This information is not intended to  replace advice given to you by your health care provider. Make sure you discuss any questions you have with your health care provider.   Document Released: 03/01/2005 Document Revised: 03/22/2014 Document Reviewed: 07/03/2013 Elsevier Interactive Patient Education 2016 [employer].   Acknowledgement of Risk of Discharge  Against Medical Advice   And Release of Liability    Because I am choosing to leave the hospital in spite of these risks, I release the hospital, its employees and officers, and my attending physician from all liability for any adverse results caused by my leaving the hospital prematurely.   ________________________________      _____________________________________ Patient's Signature                                        Date and Time   ________________________________  _____________________________________ Witness' Signature                                        Relationship to Patient    ________________________________      _____________________________________ Witness' Signature                                        Relationship to Patient   [If patient refuses to sign, write "Patient refused to sign" on the patient's signature line and have witnesses to the refusal sign as witnesses.]

## 2015-02-19 NOTE — ED Notes (Signed)
Pt states she has been having right flank pain on and off for about 3 weeks, cloudy urine, some times has difficulty going urinate

## 2015-02-20 ENCOUNTER — Encounter (HOSPITAL_COMMUNITY): Payer: Self-pay | Admitting: *Deleted

## 2015-02-20 ENCOUNTER — Emergency Department (HOSPITAL_COMMUNITY)
Admission: EM | Admit: 2015-02-20 | Discharge: 2015-02-20 | Disposition: A | Discharge status: Home / Self Care | Payer: Medicaid Other | Source: Home / Self Care | Attending: Emergency Medicine | Admitting: Emergency Medicine

## 2015-02-20 ENCOUNTER — Emergency Department (HOSPITAL_COMMUNITY): Payer: Medicaid Other

## 2015-02-20 DIAGNOSIS — F17210 Nicotine dependence, cigarettes, uncomplicated: Secondary | ICD-10-CM

## 2015-02-20 DIAGNOSIS — Z8709 Personal history of other diseases of the respiratory system: Secondary | ICD-10-CM

## 2015-02-20 DIAGNOSIS — Z79899 Other long term (current) drug therapy: Secondary | ICD-10-CM | POA: Insufficient documentation

## 2015-02-20 DIAGNOSIS — Z791 Long term (current) use of non-steroidal anti-inflammatories (NSAID): Secondary | ICD-10-CM

## 2015-02-20 DIAGNOSIS — R339 Retention of urine, unspecified: Secondary | ICD-10-CM

## 2015-02-20 DIAGNOSIS — R52 Pain, unspecified: Secondary | ICD-10-CM

## 2015-02-20 DIAGNOSIS — M545 Low back pain: Secondary | ICD-10-CM

## 2015-02-20 MED ORDER — TRAMADOL HCL 50 MG PO TABS: 50.0000 mg | ORAL_TABLET | Freq: Four times a day (QID) | ORAL | Status: DC | PRN

## 2015-02-20 NOTE — ED Notes (Signed)
Pt here last night for lower back pain and urinary retention. Dr. Elesa MassedWard wanted pt to have MRI [facility] Pt stated she could not drive to [facility]. States she went to radiology, here at [facility] today but she states they did not have an order. Here for MRI.Noted in paperwork that pt left AMA last night.

## 2015-02-20 NOTE — Discharge Instructions (Signed)
Follow up with [facility] for urinary problems

## 2015-02-20 NOTE — ED Notes (Signed)
Patient verbalized understanding of discharge instructions, home care and AMA procedure. Patient states she will return if symptoms increase. Patient ambulatory out of department at this time

## 2015-02-20 NOTE — ED Provider Notes (Addendum)
CSN: [id number]     Arrival date & time 02/20/15  1437 History   First MD Initiated Contact with Patient 02/20/15 1503     Chief Complaint  Patient presents with  . Back Pain     (Consider location/radiation/quality/duration/timing/severity/associated sxs/prior Treatment) Patient is a 29 y.o. female presenting with back pain. The history is provided by the patient (Patient states` that she's been having some pain in her right leg and numbness in her left leg. This has gotten worse over last week but has been around for a long time. She was seen in emergency department last night and was recommended to get an MRI ).  Back Pain Location:  Lumbar spine Quality:  Aching Radiates to:  R posterior upper leg Pain severity:  Mild Onset quality:  Gradual Timing:  Constant Chronicity:  Recurrent Associated symptoms: no abdominal pain, no chest pain and no headaches     Past Medical History  Diagnosis Date  . Strep throat    History reviewed. No pertinent past surgical history. No family history on file. Social History  Substance Use Topics  . Smoking status: Current Every Day Smoker -- 1.00 packs/day for 10 years    Types: Cigarettes  . Smokeless tobacco: Never Used  . Alcohol Use: No   OB History    Gravida Para Term Preterm AB TAB SAB Ectopic Multiple Living   1 1 1       1      Review of Systems  Constitutional: Negative for appetite change and fatigue.  HENT: Negative for congestion, ear discharge and sinus pressure.   Eyes: Negative for discharge.  Respiratory: Negative for cough.   Cardiovascular: Negative for chest pain.  Gastrointestinal: Negative for abdominal pain and diarrhea.  Genitourinary: Negative for frequency and hematuria.       Urinary retention  Musculoskeletal: Positive for back pain.  Skin: Negative for rash.  Neurological: Negative for seizures and headaches.  Psychiatric/Behavioral: Negative for hallucinations.      Allergies  Review of patient's  allergies indicates no known allergies.  Home Medications   Prior to Admission medications   Medication Sig Start Date End Date Taking? Authorizing Provider  diclofenac (VOLTAREN) 75 MG EC tablet Take 75 mg by mouth 2 (two) times daily.   Yes Historical Provider, MD  etonogestrel (NEXPLANON) 68 MG IMPL implant 1 each by Subdermal route once.   Yes Historical Provider, MD  gabapentin (NEURONTIN) 300 MG capsule Take 300 mg by mouth 3 (three) times daily.   Yes Historical Provider, MD  HYDROcodone-acetaminophen (NORCO/VICODIN) 5-325 MG tablet Take 1 tablet by mouth every 4 (four) hours as needed. 02/19/15  Yes Kristen N Ward, DO  traMADol (ULTRAM) 50 MG tablet Take 1 tablet (50 mg total) by mouth every 6 (six) hours as needed. 02/20/15   Bethann BerkshireJoseph Yisroel Mullendore, MD   BP 133/83 mmHg  Pulse 82  Temp(Src) 97.6 F (36.4 C) (Oral)  Resp 16  Ht 5\' 3"  (1.6 m)  Wt 130 lb (58.968 kg)  BMI 23.03 kg/m2  SpO2 100%  LMP  Physical Exam  Constitutional: She is oriented to person, place, and time. She appears well-developed.  HENT:  Head: Normocephalic.  Eyes: Conjunctivae and EOM are normal. No scleral icterus.  Neck: Neck supple. No thyromegaly present.  Cardiovascular: Normal rate and regular rhythm.  Exam reveals no gallop and no friction rub.   No murmur heard. Pulmonary/Chest: No stridor. She has no wheezes. She has no rales. She exhibits no tenderness.  Abdominal:  She exhibits no distension. There is no tenderness. There is no rebound.  Musculoskeletal: Normal range of motion. She exhibits tenderness. She exhibits no edema.  Tender lumbar spine  Lymphadenopathy:    She has no cervical adenopathy.  Neurological: She is oriented to person, place, and time. She exhibits normal muscle tone. Coordination normal.  Negative straight leg raise  Skin: No rash noted. No erythema.  Psychiatric: She has a normal mood and affect. Her behavior is normal.    ED Course  Procedures (including critical care  time) Labs Review Labs Reviewed - No data to display  Imaging Review Mr Lumbar Spine Wo Contrast  02/20/2015  CLINICAL DATA:  Low back pain.  No known injury. EXAM: MRI LUMBAR SPINE WITHOUT CONTRAST TECHNIQUE: Multiplanar, multisequence MR imaging of the lumbar spine was performed. No intravenous contrast was administered. COMPARISON:  Lumbar spine radiographs 10/02/2014. FINDINGS: Segmentation: Conventional anatomy assumed, with the last open disc space designated L5-S1. Alignment:  Normal. Bones: No worrisome osseous lesion, acute fracture or pars defect. Conus medullaris: Extends to the L1-2 level and appears normal. Paraspinal and other soft tissues: No significant paraspinal findings. Disc levels: All of the lumbar discs are well hydrated with maintained height. There is no disc herniation, spinal stenosis or nerve root encroachment. The foramina are widely patent. IMPRESSION: Normal lumbar MRI. Electronically Signed   By: Carey Bullocks M.D.   On: 02/20/2015 17:33   I have personally reviewed and evaluated these images and lab results as part of my medical decision-making.   EKG Interpretation None      MDM   Final diagnoses:  Pain  Urinary retention    Patient with back pain and urinary retention. Normal labs yesterday. She had MRI of her lumbar spine which was normal today. She'll be referred to urology for evaluation of this urinary retention. She will also follow-up with her primary doctor as needed   Bethann Berkshire, MD 02/20/15 1803  Bethann Berkshire, MD 03/06/15 1538

## 2015-02-25 MED FILL — Hydrocodone-Acetaminophen Tab 5-325 MG: ORAL | Qty: 6 | Status: AC

## 2015-04-05 ENCOUNTER — Emergency Department (HOSPITAL_COMMUNITY)
Admission: EM | Admit: 2015-04-05 | Discharge: 2015-04-05 | Disposition: A | Discharge status: Home / Self Care | Payer: Medicaid Other | Attending: Emergency Medicine | Admitting: Emergency Medicine

## 2015-04-05 ENCOUNTER — Encounter (HOSPITAL_COMMUNITY): Payer: Self-pay | Admitting: *Deleted

## 2015-04-05 DIAGNOSIS — F17210 Nicotine dependence, cigarettes, uncomplicated: Secondary | ICD-10-CM | POA: Insufficient documentation

## 2015-04-05 DIAGNOSIS — Y9389 Activity, other specified: Secondary | ICD-10-CM | POA: Insufficient documentation

## 2015-04-05 DIAGNOSIS — J3489 Other specified disorders of nose and nasal sinuses: Secondary | ICD-10-CM | POA: Insufficient documentation

## 2015-04-05 DIAGNOSIS — T1592XA Foreign body on external eye, part unspecified, left eye, initial encounter: Secondary | ICD-10-CM

## 2015-04-05 DIAGNOSIS — X58XXXA Exposure to other specified factors, initial encounter: Secondary | ICD-10-CM | POA: Diagnosis not present

## 2015-04-05 DIAGNOSIS — Y9289 Other specified places as the place of occurrence of the external cause: Secondary | ICD-10-CM | POA: Diagnosis not present

## 2015-04-05 DIAGNOSIS — S0591XA Unspecified injury of right eye and orbit, initial encounter: Secondary | ICD-10-CM | POA: Diagnosis present

## 2015-04-05 DIAGNOSIS — Z79899 Other long term (current) drug therapy: Secondary | ICD-10-CM | POA: Insufficient documentation

## 2015-04-05 DIAGNOSIS — Z791 Long term (current) use of non-steroidal anti-inflammatories (NSAID): Secondary | ICD-10-CM | POA: Diagnosis not present

## 2015-04-05 DIAGNOSIS — Y998 Other external cause status: Secondary | ICD-10-CM | POA: Diagnosis not present

## 2015-04-05 MED ORDER — TETRACAINE HCL 0.5 % OP SOLN
OPHTHALMIC | Status: AC
  Filled 2015-04-05: qty 4

## 2015-04-05 MED ORDER — TETRACAINE HCL 0.5 % OP SOLN
1.0000 [drp] | Freq: Once | OPHTHALMIC | Status: AC
  Administered 2015-04-05: 16:00:00 via OPHTHALMIC

## 2015-04-05 MED ORDER — ERYTHROMYCIN 5 MG/GM OP OINT
TOPICAL_OINTMENT | Freq: Once | OPHTHALMIC | Status: AC
  Administered 2015-04-05: 17:00:00 via OPHTHALMIC
  Filled 2015-04-05: qty 3.5

## 2015-04-05 NOTE — ED Notes (Signed)
Pt's L. Eye flushing with 250ccNS using Nona Dell per orders of J Idol,PA-C.

## 2015-04-05 NOTE — ED Notes (Signed)
Pt comes in with left eye pain. States she woke up with drainage. Pt has increased pain when she opens her eye.

## 2015-04-05 NOTE — Discharge Instructions (Signed)
Eye Foreign Body A foreign body refers to any object on the surface of the eye or in the eyeball that should not be there. A foreign body may be a small speck of dirt or dust, a hair or eyelash, a splinter, or any other object.  SIGNS AND SYMPTOMS Symptoms depend on what the foreign body is and where it is in the eye. The most common locations are:   On the inner surface of the upper or lower eyelids or on the covering of the white part of the eye (conjunctiva). Symptoms in this location are:  Pain and irritation, especially when blinking.  The feeling that something is in the eye.  On the surface of the clear covering on the front of the eye (cornea). Symptoms in this location include:  Pain and irritation.   Small "rust rings" around a metallic foreign body.  The feeling that something is in the eye.   Inside the eyeball. Foreign bodies inside the eye may cause:   Great pain.   Immediate loss of vision.   Distortion of the pupil. DIAGNOSIS  Foreign bodies are found during an exam by an eye specialist. Those on the eyelids, conjunctiva, or cornea are usually (but not always) easily found. When a foreign body is inside the eyeball, a cloudiness of the lens (cataract) may form almost right away. This makes it hard for an eye specialist to find the foreign body. Tests may be needed, including ultrasound testing, X-rays, and CT scans. TREATMENT   Foreign bodies on the eyelids, conjunctiva, or cornea are often removed easily and painlessly.  Rust in the cornea may require the use of a drill-like instrument to remove the rust.  If the foreign body has caused a scratch or a rubbing or scraping (abrasion) of the cornea, this may be treated with antibiotic drops or ointment. A pressure patch may be put over your eye.  If the foreign body is inside your eyeball, surgery is needed right away. This is a medical emergency. Foreign bodies inside the eye threaten vision. A person may even  lose his or her eye. HOME CARE INSTRUCTIONS   Take medicines only as directed by your health care provider. Use eye drops or ointment as directed.  If no eye patch was applied:  Keep your eye closed as much as possible.  Do not rub your eye.  Wear dark glasses as needed to protect your eyes from bright light.  Do not wear contact lenses until your eye feels normal again, or as instructed by your health care provider.  Wear a protective eye covering if there is a risk of eye injury. This is important when working with high-speed tools.  If your eye is patched:  Follow your health care provider's instructions for when to remove the patch.  Do notdrive or operate machinery if your eye is patched. Your ability to judge distances is impaired.  Keep all follow-up visits as directed by your health care provider. This is important. SEEK MEDICAL CARE IF:   You have increased pain in your eye.  Your vision gets worse.   You have problems with your eye patch.   You have fluid (discharge) coming from your injured eye.   You have redness and swelling around your affected eye.  MAKE SURE YOU:   Understand these instructions.  Will watch your condition.  Will get help right away if you are not doing well or get worse.   This information is not intended to  replace advice given to you by your health care provider. Make sure you discuss any questions you have with your health care provider.   Document Released: 03/01/2005 Document Revised: 03/22/2014 Document Reviewed: 07/27/2012 Elsevier Interactive Patient Education 2016 [employer].   Apply a small ribbon of the erythromycin ointment to your eye every 6 hours while awake.  Call Dr. Lita Mains for a recheck of your eye if it is not improving over the next several days.

## 2015-04-05 NOTE — ED Notes (Signed)
Pt given antibiotic eye ointment and instructions for use at discharge.

## 2015-04-08 NOTE — ED Provider Notes (Signed)
CSN: [id number]     Arrival date & time 04/05/15  1313 History   First MD Initiated Contact with Patient 04/05/15 1444     Chief Complaint  Patient presents with  . Eye Pain     (Consider location/radiation/quality/duration/timing/severity/associated sxs/prior Treatment) The history is provided by the patient.   Kristy Stokes is a 30 y.o. female  Who does not wear contacts or glasses presenting with left eye pain and foreign body sensation.  She endorses frequent episodes of similar symptoms, often lasting for 24 hours then resolves spontaneously.  This episode started this am, describes foreign body sensation beneath her left medial upper eyelid.  She denies visual changes except for mild photophobia.  There has been clear tearing, no thick discharge.  She has tried to flush her eye without relief.     Past Medical History  Diagnosis Date  . Strep throat    History reviewed. No pertinent past surgical history. No family history on file. Social History  Substance Use Topics  . Smoking status: Current Every Day Smoker -- 1.00 packs/day for 10 years    Types: Cigarettes  . Smokeless tobacco: Never Used  . Alcohol Use: No   OB History    Gravida Para Term Preterm AB TAB SAB Ectopic Multiple Living   Review of Systems  Constitutional: Negative for fever.  HENT: Negative for congestion and sore throat.   Eyes: Positive for photophobia and pain. Negative for visual disturbance.  Gastrointestinal: Negative for nausea.  Genitourinary: Negative.   Skin: Negative.  Negative for rash.  Neurological: Negative for dizziness, weakness, light-headedness, numbness and headaches.  Psychiatric/Behavioral: Negative.       Allergies  Tramadol  Home Medications   Prior to Admission medications   Medication Sig Start Date End Date Taking? Authorizing Provider  diclofenac (VOLTAREN) 75 MG EC tablet Take 75 mg by mouth 2 (two) times daily.   Yes Historical  Provider, MD  etonogestrel (NEXPLANON) 68 MG IMPL implant 1 each by Subdermal route once.   Yes Historical Provider, MD  gabapentin (NEURONTIN) 300 MG capsule Take 300 mg by mouth 3 (three) times daily.   Yes Historical Provider, MD   BP 130/85 mmHg  Pulse 80  Temp(Src) 97.7 F (36.5 C) (Oral)  Resp 16  Ht  (1.6 m)  Wt 57.153 kg  BMI 22.33 kg/m2  SpO2 100% Physical Exam  Constitutional: She is oriented to person, place, and time. She appears well-developed and well-nourished.  HENT:  Head: Normocephalic and atraumatic.  Right Ear: Tympanic membrane and ear canal normal.  Left Ear: Tympanic membrane and ear canal normal.  Nose: Mucosal edema and rhinorrhea present.  Mouth/Throat: Uvula is midline, oropharynx is clear and moist and mucous membranes are normal. No oropharyngeal exudate, posterior oropharyngeal edema, posterior oropharyngeal erythema or tonsillar abscesses.  Eyes: EOM and lids are normal. Pupils are equal, round, and reactive to light. Lids are everted and swept, no foreign bodies found. Left eye exhibits no discharge. No foreign body present in the left eye. Left conjunctiva is injected. Left eye exhibits normal extraocular motion and no nystagmus.  Slit lamp exam:      The left eye shows no corneal abrasion, no corneal flare, no corneal ulcer, no foreign body, no hyphema, no fluorescein uptake and no anterior chamber bulge.  Visual Acuity - Bilateral Near: 20 ; Bilateral Distance: 25 ; R Near: 20 ; R  Distance: 30 ; L Near: 20 ; L Distance: 30  Cardiovascular: Normal rate and normal heart sounds.   Pulmonary/Chest: Effort normal. No respiratory distress. She has no wheezes. She has no rales.  Abdominal: Soft. There is no tenderness.  Musculoskeletal: Normal range of motion.  Neurological: She is alert and oriented to person, place, and time.  Skin: Skin is warm and dry. No rash noted.  Psychiatric: She has a normal mood and affect.    ED Course  Procedures  (including critical care time) Labs Review Labs Reviewed - No data to display  Imaging Review No results found. I have personally reviewed and evaluated these images and lab results as part of my medical decision-making.   EKG Interpretation None      MDM   Final diagnoses:  Eye foreign body, left, initial encounter    No fb, no abrasion on exam.  Pt's eye was flushed with NS with some but not complete resolution of pain.  She was given erythromycin ointment for pain relief and to protect the eye.  She was referred to Dr. Lita Mains for f/u care if sx persist.  Recommended f/u regardless of this episodes progression, given chronic intermittent sx.    Burgess Amor, PA-C 04/08/15 1254  Glynn Octave, MD 04/08/15 [phone]

## 2015-12-07 ENCOUNTER — Encounter (HOSPITAL_COMMUNITY): Payer: Self-pay | Admitting: Emergency Medicine

## 2015-12-07 ENCOUNTER — Emergency Department (HOSPITAL_COMMUNITY)
Admission: EM | Admit: 2015-12-07 | Discharge: 2015-12-07 | Disposition: A | Discharge status: Home / Self Care | Payer: Medicaid Other | Attending: Emergency Medicine | Admitting: Emergency Medicine

## 2015-12-07 DIAGNOSIS — K029 Dental caries, unspecified: Secondary | ICD-10-CM | POA: Insufficient documentation

## 2015-12-07 DIAGNOSIS — F17210 Nicotine dependence, cigarettes, uncomplicated: Secondary | ICD-10-CM | POA: Diagnosis not present

## 2015-12-07 DIAGNOSIS — K0889 Other specified disorders of teeth and supporting structures: Secondary | ICD-10-CM

## 2015-12-07 MED ORDER — NAPROXEN 250 MG PO TABS: 250.0000 mg | ORAL_TABLET | Freq: Two times a day (BID) | ORAL | 0 refills | Status: DC | PRN

## 2015-12-07 MED ORDER — PENICILLIN V POTASSIUM 250 MG PO TABS: 250.0000 mg | ORAL_TABLET | Freq: Four times a day (QID) | ORAL | 0 refills | Status: DC

## 2015-12-07 NOTE — ED Triage Notes (Signed)
Pt reports L upper jaw dental pain. Pt states she is scheduled for dental procedure but the tooth has started breaking off.

## 2015-12-07 NOTE — Discharge Instructions (Signed)
Take the prescriptions as directed.  Call your regular dentist tomorrow to schedule a follow up appointment this week.  Return to the Emergency Department immediately sooner if worsening.

## 2015-12-07 NOTE — ED Provider Notes (Signed)
[facility]PT Provider Note   CSN: [id number] Arrival date & time: 12/07/15  1809     History   Chief Complaint Chief Complaint  Patient presents with  . Dental Pain    HPI Kristy Stokes is a 30 y.o. female.   Dental Pain      Pt was seen at 1940. Per pt, c/o gradual onset and persistence of constant left upper tooth "pain" for the past several weeks. States she "thinks I have a cavity in between the teeth because when I floss pieces of the tooth look like they're breaking off."  Denies fevers, no intra-oral edema, no rash, no facial swelling, no dysphagia, no neck pain.   The condition is aggravated by nothing. The condition is relieved by nothing. The symptoms have been associated with no other complaints. The patient has no significant history of serious medical conditions.    Past Medical History:  Diagnosis Date  . Strep throat     There are no active problems to display for this patient.   History reviewed. No pertinent surgical history.  OB History    Gravida Para Term Preterm AB Living   1 1 1     1    SAB TAB Ectopic Multiple Live Births                   Home Medications    Prior to Admission medications   Medication Sig Start Date End Date Taking? Authorizing Provider  diclofenac (VOLTAREN) 75 MG EC tablet Take 75 mg by mouth 2 (two) times daily.    Historical Provider, MD  etonogestrel (NEXPLANON) 68 MG IMPL implant 1 each by Subdermal route once.    Historical Provider, MD  gabapentin (NEURONTIN) 300 MG capsule Take 300 mg by mouth 3 (three) times daily.    Historical Provider, MD    Family History History reviewed. No pertinent family history.  Social History Social History  Substance Use Topics  . Smoking status: Current Every Day Smoker    Packs/day: 1.00    Years: 10.00    Types: Cigarettes  . Smokeless tobacco: Never Used  . Alcohol use No     Allergies   Tramadol   Review of Systems Review of Systems ROS: Statement:  All systems negative except as marked or noted in the HPI; Constitutional: Negative for fever and chills. ; ; Eyes: Negative for eye pain and discharge. ; ; ENMT: Positive for dental caries, dental hygiene poor and toothache. Negative for ear pain, bleeding gums, dental injury, facial deformity, facial swelling, hoarseness, nasal congestion, sinus pressure, sore throat, throat swelling and tongue swollen. ; ; Cardiovascular: Negative for chest pain, palpitations, diaphoresis, dyspnea and peripheral edema. ; ; Respiratory: Negative for cough, wheezing and stridor. ; ; Gastrointestinal: Negative for nausea, vomiting, diarrhea and abdominal pain. ; ; Genitourinary: Negative for dysuria, flank pain and hematuria. ; ; Musculoskeletal: Negative for back pain and neck pain. ; ; Skin: Negative for rash and skin lesion. ; ; Neuro: Negative for headache, lightheadedness and neck stiffness.      Physical Exam Updated Vital Signs BP 131/84 (BP Location: Right Arm)   Pulse 80   Temp 99.4 F (37.4 C) (Temporal)   Resp 16   Ht 5\' 3"  (1.6 m)   Wt 125 lb (56.7 kg)   SpO2 100%   BMI 22.14 kg/m   Physical Exam 1945: Physical examination: Vital signs and O2 SAT: Reviewed; Constitutional: Well developed, Well nourished, Well hydrated, In no  acute distress; Head and Face: Normocephalic, Atraumatic; Eyes: EOMI, PERRL, No scleral icterus; ENMT: Mouth and pharynx normal, Poor dentition, Widespread dental decay, Left TM normal, Right TM normal, Mucous membranes moist, +upper left 1st molar with dental decay.  No gingival erythema, edema, fluctuance, or drainage.  No intra-oral edema. No submandibular or sublingual edema. No hoarse voice, no drooling, no stridor. No trismus. ; Neck: Supple, Full range of motion, No lymphadenopathy; Cardiovascular: Regular rate and rhythm, No murmur, rub, or gallop; Respiratory: Breath sounds clear & equal bilaterally, No rales, rhonchi, wheezes, Normal respiratory effort/excursion; Chest:  Nontender, Movement normal; Extremities: Pulses normal, No tenderness, No edema; Neuro: AA&Ox3, Major CN grossly intact.  No gross focal motor or sensory deficits in extremities.; Skin: Color normal, No rash, No petechiae, Warm, Dry   ED Treatments / Results  Labs (all labs ordered are listed, but only abnormal results are displayed)   EKG  EKG Interpretation None       Radiology   Procedures Procedures (including critical care time)  Medications Ordered in ED Medications - No data to display   Initial Impression / Assessment and Plan / ED Course  I have reviewed the triage vital signs and the nursing notes.  Pertinent labs & imaging results that were available during my care of the patient were reviewed by me and considered in my medical decision making (see chart for details).  MDM Reviewed: previous chart, nursing note and vitals   1945:  Pt encouraged to f/u with dentist or oral surgeon for her dental needs for good continuity of care and definitive treatment.  Pt verb understanding.    Final Clinical Impressions(s) / ED Diagnoses   Final diagnoses:  None    New Prescriptions New Prescriptions   No medications on file      Samuel Jester, DO 12/08/15 1723

## 2020-04-24 ENCOUNTER — Other Ambulatory Visit: Payer: Self-pay

## 2020-04-24 ENCOUNTER — Emergency Department (HOSPITAL_COMMUNITY): Payer: Medicaid Other

## 2020-04-24 ENCOUNTER — Emergency Department (HOSPITAL_COMMUNITY)
Admission: EM | Admit: 2020-04-24 | Discharge: 2020-04-24 | Disposition: A | Discharge status: Home / Self Care | Payer: Medicaid Other | Attending: Emergency Medicine | Admitting: Emergency Medicine

## 2020-04-24 ENCOUNTER — Encounter (HOSPITAL_COMMUNITY): Payer: Self-pay | Admitting: *Deleted

## 2020-04-24 DIAGNOSIS — R0781 Pleurodynia: Secondary | ICD-10-CM

## 2020-04-24 DIAGNOSIS — S0990XA Unspecified injury of head, initial encounter: Secondary | ICD-10-CM | POA: Diagnosis not present

## 2020-04-24 DIAGNOSIS — F17210 Nicotine dependence, cigarettes, uncomplicated: Secondary | ICD-10-CM | POA: Diagnosis not present

## 2020-04-24 DIAGNOSIS — M25421 Effusion, right elbow: Secondary | ICD-10-CM

## 2020-04-24 DIAGNOSIS — M79641 Pain in right hand: Secondary | ICD-10-CM | POA: Diagnosis not present

## 2020-04-24 MED ORDER — IBUPROFEN 800 MG PO TABS: 800.0000 mg | ORAL_TABLET | Freq: Once | ORAL | Status: DC
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
## 2020-04-24 NOTE — ED Notes (Signed)
Pt sat up in bed, repositioned, lights turned on and noise provided to assist in continued stimuli. When awake, patient is alert and oriented, but drifts back to sleep while speaking with this RN. Respirations are even and unlabored with equal chest rise and fall. Bed is locked in the lowest position, side rails x2, call bell within reach.

## 2020-04-24 NOTE — ED Notes (Addendum)
Patient transported to CT.  6:13 PM Pt returned from CT and x-ray at this time.
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
## 2020-04-24 NOTE — ED Notes (Signed)
ED Provider at bedside. 

## 2020-04-24 NOTE — ED Notes (Signed)
Entered room and introduced self to patient. This RN notes that patient appears asleep in bed, awakens with stimuli but difficult to keep awake. Respirations are even and unlabored with equal chest rise and fall. Bed is locked in the lowest position, side rails x2, call bell within reach. Room darkened, but door remains open for better observation.

## 2020-10-08 ENCOUNTER — Other Ambulatory Visit: Payer: Self-pay

## 2020-10-08 ENCOUNTER — Encounter (HOSPITAL_COMMUNITY): Payer: Self-pay | Admitting: *Deleted

## 2020-10-08 ENCOUNTER — Emergency Department (HOSPITAL_COMMUNITY)
Admission: EM | Admit: 2020-10-08 | Discharge: 2020-10-08 | Disposition: A | Discharge status: Home / Self Care | Payer: Medicaid Other | Attending: Emergency Medicine | Admitting: Emergency Medicine

## 2020-10-08 DIAGNOSIS — K029 Dental caries, unspecified: Secondary | ICD-10-CM | POA: Insufficient documentation

## 2020-10-08 DIAGNOSIS — F17210 Nicotine dependence, cigarettes, uncomplicated: Secondary | ICD-10-CM | POA: Diagnosis not present

## 2020-10-08 DIAGNOSIS — K0889 Other specified disorders of teeth and supporting structures: Secondary | ICD-10-CM | POA: Diagnosis present

## 2020-10-08 HISTORY — DX: Calculus of kidney: N20.0

## 2020-10-08 MED ORDER — NAPROXEN 500 MG PO TABS: 500.0000 mg | ORAL_TABLET | Freq: Two times a day (BID) | ORAL | 0 refills | Status: AC

## 2020-10-08 MED ORDER — CHLORHEXIDINE GLUCONATE 0.12 % MT SOLN: 15.0000 mL | Freq: Two times a day (BID) | OROMUCOSAL | 0 refills | Status: AC

## 2020-10-08 MED ORDER — OXYCODONE HCL 5 MG PO TABS: 5.0000 mg | ORAL_TABLET | Freq: Three times a day (TID) | ORAL | 0 refills | Status: AC | PRN

## 2020-10-08 MED ORDER — AMOXICILLIN 500 MG PO CAPS: 500.0000 mg | ORAL_CAPSULE | Freq: Three times a day (TID) | ORAL | 0 refills | Status: DC
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
## 2020-10-08 NOTE — ED Provider Notes (Signed)
[facility] EMERGENCY DEPARTMENT Provider Note   CSN: [id number] Arrival date & time: 10/08/20  4098    History Dental pain   Kristy Stokes is a 35 y.o. female with no significant past medical history who presents for evaluation of dental pain.  Pain to right upper dentition which began yesterday.  Will occasionally radiate into her right ear.  Not taking anything for pain.  States she was not able to sleep last night secondary to pain.  Her pain a 9/10.  No fever, chills, nausea, vomiting, drooling, dysphagia, pain, neck stiffness, facial swelling.  Denies additional aggravating or alleviating factors.  History obtained from patient and past medical records.  No interpreter used.  HPI     Past Medical History:  Diagnosis Date   Kidney stone    Strep throat     There are no problems to display for this patient.   Past Surgical History:  Procedure Laterality Date   WISDOM TOOTH EXTRACTION       OB History     Gravida  1   Para  1   Term  1   Preterm      AB      Living  1      SAB      IAB      Ectopic      Multiple      Live Births              No family history on file.  Social History   Tobacco Use   Smoking status: Every Day    Packs/day: 0.50    Years: 10.00    Pack years: 5.00    Types: Cigarettes   Smokeless tobacco: Never  Vaping Use   Vaping Use: Never used  Substance Use Topics   Alcohol use: No   Drug use: Yes    Types: Marijuana    Home Medications Prior to Admission medications   Medication Sig Start Date End Date Taking? Authorizing Provider  amoxicillin (AMOXIL) 500 MG capsule Take 1 capsule (500 mg total) by mouth 3 (three) times daily. 10/08/20  Yes Bryn Perkin A, PA-C  chlorhexidine (PERIDEX) 0.12 % solution Use as directed 15 mLs in the mouth or throat 2 (two) times daily. 10/08/20  Yes Kyri Shader A, PA-C  naproxen (NAPROSYN) 500 MG tablet Take 1 tablet (500 mg total) by mouth 2 (two) times daily.  10/08/20  Yes Francoise Chojnowski A, PA-C  oxyCODONE (ROXICODONE) 5 MG immediate release tablet Take 1 tablet (5 mg total) by mouth every 8 (eight) hours as needed for up to 2 days for severe pain. 10/08/20 10/10/20 Yes Leonid Manus A, PA-C  diclofenac (VOLTAREN) 75 MG EC tablet Take 75 mg by mouth 2 (two) times daily.    [provider]  etonogestrel (NEXPLANON) 68 MG IMPL implant 1 each by Subdermal route once.    [provider]  gabapentin (NEURONTIN) 300 MG capsule Take 300 mg by mouth 3 (three) times daily.    [provider]  penicillin v potassium (VEETID) 250 MG tablet Take 1 tablet (250 mg total) by mouth 4 (four) times daily. 12/07/15   Samuel Jester, DO    Allergies    Tramadol  Review of Systems   Review of Systems  Constitutional: Negative.   HENT:  Positive for dental problem and ear pain (right ear fullness). Negative for congestion, ear discharge, facial swelling, hearing loss, mouth sores, postnasal drip, rhinorrhea, sinus pressure, sinus  pain, sneezing, sore throat, tinnitus and voice change.   Respiratory: Negative.    Cardiovascular: Negative.   Gastrointestinal: Negative.   Genitourinary: Negative.   Musculoskeletal: Negative.   Skin: Negative.   Neurological: Negative.   All other systems reviewed and are negative.  Physical Exam Updated Vital Signs BP 121/88 (BP Location: Right Arm)   Pulse 78   Temp 98.2 F (36.8 C) (Oral)   Resp 18   Ht 5\' 3"  (1.6 m)   Wt 59 kg   SpO2 100%   BMI 23.03 kg/m   Physical Exam Vitals and nursing note reviewed.  Constitutional:      General: She is not in acute distress.    Appearance: She is well-developed. She is not ill-appearing, toxic-appearing or diaphoretic.  HENT:     Head: Normocephalic and atraumatic.     Jaw: There is normal jaw occlusion.     Comments: No facial swelling, drooling, dysphagia or trismus    Right Ear: Tympanic membrane, ear canal and external ear normal. There is  no impacted cerumen.     Left Ear: Tympanic membrane, ear canal and external ear normal. There is no impacted cerumen.     Nose: Nose normal.     Mouth/Throat:     Lips: Pink.     Mouth: Mucous membranes are moist.     Dentition: Abnormal dentition. Dental tenderness and dental caries present.     Pharynx: Oropharynx is clear. Uvula midline.     Tonsils: No tonsillar exudate or tonsillar abscesses.      Comments: Poor dentition, multiple missing teeth, some teeth eroded to gumline, multiple dental caries.  Mild gingival erythema to right gingiva however no drainable abscess.  Sublingual area soft Eyes:     Pupils: Pupils are equal, round, and reactive to light.  Cardiovascular:     Rate and Rhythm: Normal rate.  Pulmonary:     Effort: No respiratory distress.  Abdominal:     General: There is no distension.  Musculoskeletal:        General: Normal range of motion.     Cervical back: Normal range of motion.  Skin:    General: Skin is warm and dry.     Capillary Refill: Capillary refill takes less than 2 seconds.     Comments: Multiple picking wounds to BL upper and lower extremities.  Neurological:     General: No focal deficit present.     Mental Status: She is alert.  Psychiatric:        Mood and Affect: Mood normal.    ED Results / Procedures / Treatments   Labs (all labs ordered are listed, but only abnormal results are displayed) Labs Reviewed - No data to display  EKG None  Radiology No results found.  Procedures Procedures   Medications Ordered in ED Medications - No data to display  ED Course  I have reviewed the triage vital signs and the nursing notes.  Pertinent labs & imaging results that were available during my care of the patient were reviewed by me and considered in my medical decision making (see chart for details).  For evaluation of dental pain.  Patient overall poor dentition, multiple missing teeth, some teeth eroded to gumline with multiple  dental caries.  She has mild gingival erythema to right upper dental however no drainable abscess.  Sublingual area soft.  She has no facial swelling.  Admits to some right ear fullness however no evidence of otitis on exam.  She has no jaw claudication.  This is likely radiation of pain from her dental infection.  She has no drooling, dysphagia or trismus.    Low suspicion for trigeminal neuralgia, giant cell, dissection, deep space infection, PTA, RPA, ludwigs, meningitis, mastoiditis.   Plan on antibiotics, pain control, close follow up with dentistry.  Patient agreeable  The patient has been appropriately medically screened and/or stabilized in the ED. I have low suspicion for any other emergent medical condition which would require further screening, evaluation or treatment in the ED or require inpatient management.  Patient is hemodynamically stable and in no acute distress.  Patient able to ambulate in department prior to ED.  Evaluation does not show acute pathology that would require ongoing or additional emergent interventions while in the emergency department or further inpatient treatment.  I have discussed the diagnosis with the patient and answered all questions.  Pain is been managed while in the emergency department and patient has no further complaints prior to discharge.  Patient is comfortable with plan discussed in room and is stable for discharge at this time.  I have discussed strict return precautions for returning to the emergency department.  Patient was encouraged to follow-up with PCP/specialist refer to at discharge.     MDM Rules/Calculators/A&P                            Final Clinical Impression(s) / ED Diagnoses Final diagnoses:  Pain, dental    Rx / DC Orders ED Discharge Orders          Ordered    amoxicillin (AMOXIL) 500 MG capsule  3 times daily        10/08/20 1040    oxyCODONE (ROXICODONE) 5 MG immediate release tablet  Every 8 hours PRN         10/08/20 1040    naproxen (NAPROSYN) 500 MG tablet  2 times daily        10/08/20 1040    chlorhexidine (PERIDEX) 0.12 % solution  2 times daily        10/08/20 1040             Shahida Schnackenberg A, PA-C 10/08/20 1050    Terald Sleeper, MD 10/08/20 1824

## 2020-10-08 NOTE — Discharge Instructions (Addendum)
Follow up with dentistry

## 2021-01-05 ENCOUNTER — Encounter (HOSPITAL_COMMUNITY): Payer: Self-pay | Admitting: *Deleted

## 2021-01-05 ENCOUNTER — Emergency Department (HOSPITAL_COMMUNITY)
Admission: EM | Admit: 2021-01-05 | Discharge: 2021-01-05 | Discharge status: Against Medical Advice | Payer: Medicaid Other | Attending: Emergency Medicine | Admitting: Emergency Medicine

## 2021-01-05 ENCOUNTER — Other Ambulatory Visit: Payer: Self-pay

## 2021-01-05 DIAGNOSIS — K0889 Other specified disorders of teeth and supporting structures: Secondary | ICD-10-CM

## 2021-01-05 DIAGNOSIS — F17210 Nicotine dependence, cigarettes, uncomplicated: Secondary | ICD-10-CM | POA: Diagnosis not present

## 2021-01-05 DIAGNOSIS — K029 Dental caries, unspecified: Secondary | ICD-10-CM | POA: Insufficient documentation

## 2021-01-05 MED ORDER — OXYCODONE-ACETAMINOPHEN 5-325 MG PO TABS: 1.0000 | ORAL_TABLET | Freq: Three times a day (TID) | ORAL | 0 refills | Status: DC | PRN

## 2021-01-05 MED ORDER — OXYCODONE-ACETAMINOPHEN 5-325 MG PO TABS: 1.0000 | ORAL_TABLET | Freq: Three times a day (TID) | ORAL | 0 refills | Status: AC | PRN

## 2021-01-05 MED ORDER — PENICILLIN V POTASSIUM 500 MG PO TABS: 500.0000 mg | ORAL_TABLET | Freq: Four times a day (QID) | ORAL | 0 refills | Status: AC

## 2021-01-05 MED ORDER — PENICILLIN V POTASSIUM 250 MG PO TABS
500.0000 mg | ORAL_TABLET | Freq: Once | ORAL | Status: AC
  Administered 2021-01-05: 500 mg via ORAL
  Filled 2021-01-05: qty 2

## 2021-01-05 NOTE — ED Triage Notes (Signed)
Unable to obtain MSE signature due to e-signature pad not working 

## 2021-01-05 NOTE — ED Notes (Signed)
Dr. Charm Barges made aware pt leaving AMA

## 2021-01-05 NOTE — Discharge Instructions (Signed)
Exam was reassuring,  starting you antibiotics please take as prescribed. I have given you a short course of narcotics please take as prescribed.  This medication can make you drowsy do not consume alcohol or operate heavy machinery when taking this medication.  This medication is Tylenol in it do not take Tylenol and take this medication.  Please follow-up with dentist for further evaluation.  Come back to the emergency department if you develop chest pain, shortness of breath, severe abdominal pain, uncontrolled nausea, vomiting, diarrhea.

## 2021-01-05 NOTE — ED Triage Notes (Signed)
Pt with right dental pain for several weeks, woke up this morning swelling to right face .

## 2021-01-06 NOTE — ED Provider Notes (Signed)
[facility] EMERGENCY DEPARTMENT Provider Note   CSN: [id number] Arrival date & time: 01/05/21  1649     History Chief Complaint  Patient presents with   Dental Pain    KEELEY SUSSMAN is a 35 y.o. female.  HPI  Patient with no significant medical history presents to the emergency department with chief complaint of dental pain.  Patient states she has a chipped right upper tooth, states that she chipped it a few weeks ago, but over the last couple days the pain has gotten worse.  She states that she has had some swelling on the right side of her cheek, she denies tongue or throat swelling, difficulty breathing, or difficulty swallowing, she denies  trismus or torticollis.  She states that she has pain when she opens and close her mouth, increased in severity to temperature changes.  She has no fevers, chills, chest pain, shortness of breath, worsening pedal edema.  She has no other complaints at this time.  States that she is going to follow-up with a dentist for further evaluation.  She denies any alleviating factors.  Past Medical History:  Diagnosis Date   Kidney stone    Strep throat     There are no problems to display for this patient.   Past Surgical History:  Procedure Laterality Date   WISDOM TOOTH EXTRACTION       OB History     Gravida  1   Para  1   Term  1   Preterm      AB      Living  1      SAB      IAB      Ectopic      Multiple      Live Births              History reviewed. No pertinent family history.  Social History   Tobacco Use   Smoking status: Every Day    Packs/day: 0.50    Years: 10.00    Pack years: 5.00    Types: Cigarettes   Smokeless tobacco: Never  Vaping Use   Vaping Use: Never used  Substance Use Topics   Alcohol use: No   Drug use: Yes    Types: Marijuana    Home Medications Prior to Admission medications   Medication Sig Start Date End Date Taking? Authorizing Provider  penicillin v potassium  (VEETID) 500 MG tablet Take 1 tablet (500 mg total) by mouth 4 (four) times daily for 7 days. 01/05/21 01/12/21 Yes Carroll Sage, PA-C  amoxicillin (AMOXIL) 500 MG capsule Take 1 capsule (500 mg total) by mouth 3 (three) times daily. 10/08/20   Henderly, Britni A, PA-C  chlorhexidine (PERIDEX) 0.12 % solution Use as directed 15 mLs in the mouth or throat 2 (two) times daily. 10/08/20   Henderly, Britni A, PA-C  diclofenac (VOLTAREN) 75 MG EC tablet Take 75 mg by mouth 2 (two) times daily.    [provider]  etonogestrel (NEXPLANON) 68 MG IMPL implant 1 each by Subdermal route once.    [provider]  gabapentin (NEURONTIN) 300 MG capsule Take 300 mg by mouth 3 (three) times daily.    [provider]  naproxen (NAPROSYN) 500 MG tablet Take 1 tablet (500 mg total) by mouth 2 (two) times daily. 10/08/20   Henderly, Britni A, PA-C  oxyCODONE-acetaminophen (PERCOCET/ROXICET) 5-325 MG tablet Take 1 tablet by mouth every 8 (eight) hours as needed for up  to 2 days for severe pain. 01/05/21 01/07/21  Carroll Sage, PA-C    Allergies    Tramadol  Review of Systems   Review of Systems  Constitutional:  Negative for chills and fever.  HENT:  Positive for dental problem and facial swelling. Negative for congestion, drooling, sore throat, tinnitus and trouble swallowing.   Respiratory:  Negative for shortness of breath.   Cardiovascular:  Negative for chest pain.  Gastrointestinal:  Negative for abdominal pain.  Genitourinary:  Negative for enuresis.  Musculoskeletal:  Negative for back pain.  Skin:  Negative for rash.  Neurological:  Negative for dizziness.  Hematological:  Does not bruise/bleed easily.   Physical Exam Updated Vital Signs BP 115/72   Pulse 67   Temp 98 F (36.7 C)   Resp 15   Ht 5\' 3"  (1.6 m)   Wt 58.1 kg   LMP 01/04/2021   SpO2 100%   BMI 22.67 kg/m   Physical Exam Vitals and nursing note reviewed.  Constitutional:      General:  She is not in acute distress.    Appearance: She is not ill-appearing.  HENT:     Head: Normocephalic and atraumatic.     Nose: No congestion.     Mouth/Throat:     Mouth: Mucous membranes are moist.     Pharynx: Oropharynx is clear. No oropharyngeal exudate or posterior oropharyngeal erythema.     Comments: Patient has noted facial swelling on the right side of her cheek no overlying erythema or edema present, no trismus or torticollis present, oropharynx is visualized tongue uvula are both midline, controlling oral secretions, tonsils were equal and symmetrical bilaterally, there is no elevation of the tongue.  She does have noted poor dental hygiene, multiple dental cavities present in the right upper molars, no noted erythema or edema around the gumline, gumline's are palpated no fluctuant indurations present. Eyes:     Extraocular Movements: Extraocular movements intact.     Conjunctiva/sclera: Conjunctivae normal.  Cardiovascular:     Rate and Rhythm: Normal rate and regular rhythm.     Pulses: Normal pulses.     Heart sounds: No murmur heard.   No friction rub. No gallop.  Pulmonary:     Effort: No respiratory distress.     Breath sounds: No wheezing, rhonchi or rales.  Skin:    General: Skin is warm and dry.  Neurological:     Mental Status: She is alert.  Psychiatric:        Mood and Affect: Mood normal.    ED Results / Procedures / Treatments   Labs (all labs ordered are listed, but only abnormal results are displayed) Labs Reviewed - No data to display  EKG None  Radiology No results found.  Procedures Procedures   Medications Ordered in ED Medications  penicillin v potassium (VEETID) tablet 500 mg (500 mg Oral Given 01/05/21 2351)    ED Course  I have reviewed the triage vital signs and the nursing notes.  Pertinent labs & imaging results that were available during my care of the patient were reviewed by me and considered in my medical decision making  (see chart for details).    MDM Rules/Calculators/A&P                          Initial impression-patient presents with dental pain.  She is alert, does not appear in acute stress, vital signs are reassuring.  Work-up-due to well-appearing  patient, benign physical exam, further lab work/ imaging not want at this time.  Rule out- I have low suspicion for peritonsillar abscess, retropharyngeal abscess, or Ludwig angina as oropharynx was visualized tongue and uvula were both midline, there is no exudates, erythema or edema noted in the posterior pillars or on/ around tonsils.  Low suspicion for an abscess as gumline were palpated no fluctuance or induration felt.  Low suspicion for periorbital or orbital cellulitis as patient face had no erythematous, patient EOMs were intact, he had no pain with eye movement.   Plan-  Dental pain-likely secondary to a dental cavity, will start her on antibiotics, provide her with a short course of pain medication, follow-up with a dentist for further evaluation.  Vital signs have remained stable, no indication for hospital admission.  Patient discussed with attending and they agreed with assessment and plan.  Patient given at home care as well strict return precautions.  Patient verbalized that they understood agreed to said plan.  Final Clinical Impression(s) / ED Diagnoses Final diagnoses:  Pain, dental    Rx / DC Orders ED Discharge Orders          Ordered    penicillin v potassium (VEETID) 500 MG tablet  4 times daily        01/05/21 2338    oxyCODONE-acetaminophen (PERCOCET/ROXICET) 5-325 MG tablet  Every 8 hours PRN,   Status:  Discontinued        01/05/21 2338    oxyCODONE-acetaminophen (PERCOCET/ROXICET) 5-325 MG tablet  Every 8 hours PRN        01/05/21 2338             Carroll Sage, PA-C 01/06/21 0022    Terrilee Files, MD 01/06/21 1101
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
## 2022-11-02 ENCOUNTER — Encounter (HOSPITAL_COMMUNITY): Payer: Self-pay | Admitting: Emergency Medicine

## 2022-11-02 ENCOUNTER — Emergency Department (HOSPITAL_COMMUNITY)
Admission: EM | Admit: 2022-11-02 | Discharge: 2022-11-02 | Disposition: A | Discharge status: Home / Self Care | Payer: Medicaid Other | Attending: Emergency Medicine | Admitting: Emergency Medicine

## 2022-11-02 DIAGNOSIS — K029 Dental caries, unspecified: Secondary | ICD-10-CM | POA: Diagnosis not present

## 2022-11-02 DIAGNOSIS — K047 Periapical abscess without sinus: Secondary | ICD-10-CM | POA: Diagnosis not present

## 2022-11-02 DIAGNOSIS — R220 Localized swelling, mass and lump, head: Secondary | ICD-10-CM | POA: Diagnosis present

## 2022-11-02 MED ORDER — CLINDAMYCIN HCL 300 MG PO CAPS: 300.0000 mg | ORAL_CAPSULE | Freq: Four times a day (QID) | ORAL | 0 refills | Status: AC

## 2022-11-02 MED ORDER — CLINDAMYCIN HCL 150 MG PO CAPS
300.0000 mg | ORAL_CAPSULE | Freq: Once | ORAL | Status: AC
  Administered 2022-11-02: 300 mg via ORAL
  Filled 2022-11-02: qty 2

## 2022-11-02 NOTE — ED Provider Notes (Signed)
[place] EMERGENCY DEPARTMENT AT [facility] Provider Note   CSN: [id number] Arrival date & time: 11/02/22  1817     History  No chief complaint on file.   Kristy Stokes is a 37 y.o. female.  HPI      Kristy Stokes is a 37 y.o. female who presents to the Emergency Department complaining of left-sided facial pain and swelling.  Symptoms gradually worsening for few days.  States that she feels like she has a "knot" to her left cheek area.  She describes sharp pain radiating to her left ear as well.  She has history of dental problems, has a mildly tender left upper molar.  She denies any pain with chewing, sore throat, difficulty swallowing, neck pain, fever or chills.   Home Medications Prior to Admission medications   Medication Sig Start Date End Date Taking? Authorizing Provider  amoxicillin (AMOXIL) 500 MG capsule Take 1 capsule (500 mg total) by mouth 3 (three) times daily. 10/08/20   Henderly, Britni A, PA-C  chlorhexidine (PERIDEX) 0.12 % solution Use as directed 15 mLs in the mouth or throat 2 (two) times daily. 10/08/20   Henderly, Britni A, PA-C  diclofenac (VOLTAREN) 75 MG EC tablet Take 75 mg by mouth 2 (two) times daily.    [provider]  etonogestrel (NEXPLANON) 68 MG IMPL implant 1 each by Subdermal route once.    [provider]  gabapentin (NEURONTIN) 300 MG capsule Take 300 mg by mouth 3 (three) times daily.    [provider]  naproxen (NAPROSYN) 500 MG tablet Take 1 tablet (500 mg total) by mouth 2 (two) times daily. 10/08/20   Henderly, Britni A, PA-C      Allergies    Tramadol    Review of Systems   Review of Systems  Constitutional:  Negative for chills and fever.  HENT:  Positive for dental problem and facial swelling. Negative for sore throat and trouble swallowing.   Respiratory:  Negative for shortness of breath.   Cardiovascular:  Negative for chest pain.  Gastrointestinal:  Negative for nausea and  vomiting.  Genitourinary:  Negative for dysuria and flank pain.  Musculoskeletal:  Negative for back pain.  Skin:  Negative for color change.  Neurological:  Negative for dizziness, weakness and headaches.    Physical Exam Updated Vital Signs BP (!) 137/98 (BP Location: Right Arm)   Pulse 86   Temp 98.7 F (37.1 C) (Oral)   Resp 16   Ht 5\' 3"  (1.6 m)   Wt 58 kg   SpO2 100%   BMI 22.65 kg/m  Physical Exam Vitals and nursing note reviewed.  Constitutional:      Appearance: Normal appearance.  HENT:     Head: Normocephalic.     Right Ear: Tympanic membrane and ear canal normal.     Left Ear: Tympanic membrane and ear canal normal.     Nose: Nose normal.     Mouth/Throat:     Mouth: Mucous membranes are moist.     Dentition: Dental tenderness and dental caries present. No gum lesions.     Pharynx: Oropharynx is clear. Uvula midline. No pharyngeal swelling, posterior oropharyngeal erythema or uvula swelling.     Comments: Tenderness palpation left upper second molar.  Dental caries noted.  Erythema and tenderness of the surrounding gums.  No fluctuance.  I do not appreciate any significant facial edema.  Uvula midline nonedematous.  No trismus, no sublingual abnormality. Eyes:  Extraocular Movements: Extraocular movements intact.     Conjunctiva/sclera: Conjunctivae normal.     Pupils: Pupils are equal, round, and reactive to light.  Cardiovascular:     Rate and Rhythm: Normal rate and regular rhythm.     Pulses: Normal pulses.  Pulmonary:     Effort: Pulmonary effort is normal.  Musculoskeletal:        General: Normal range of motion.     Cervical back: Normal range of motion and neck supple.  Lymphadenopathy:     Cervical: No cervical adenopathy.  Skin:    General: Skin is warm.     Capillary Refill: Capillary refill takes less than 2 seconds.  Neurological:     General: No focal deficit present.     Mental Status: She is alert.     Sensory: No sensory deficit.      Motor: No weakness.     ED Results / Procedures / Treatments   Labs (all labs ordered are listed, but only abnormal results are displayed) Labs Reviewed - No data to display  EKG None  Radiology No results found.  Procedures Procedures    Medications Ordered in ED Medications  clindamycin (CLEOCIN) capsule 300 mg (has no administration in time range)    ED Course/ Medical Decision Making/ A&P                                 Medical Decision Making Patient here with left-sided facial pain for several days.  Has history of dental decay.  She is concerned that she may have a sinus infection.  No fever or chills, denies any significant nasal congestion neck pain or stiffness.  Vital signs reviewed, with patient is well-appearing.  Localized tenderness left upper second molar.  Some mild swelling of the surrounding gingiva.  I do not appreciate any significant facial edema on exam.  No trismus, no concerning symptoms for Ludewig's angina.  I suspect symptoms are related to developing dental abscess.  There is no drainable abscess at this time  Amount and/or Complexity of Data Reviewed Discussion of management or test interpretation with external provider(s): Patient well-appearing.  Nontoxic.  Will treat with antibiotics.  Referral information given for local dentistry.  She appears appropriate for discharge home.  All questions were answered.  Risk Prescription drug management.           Final Clinical Impression(s) / ED Diagnoses Final diagnoses:  Dental infection    Rx / DC Orders ED Discharge Orders     None         Rosey Bath 11/02/22 1914    Eber Hong, MD 11/02/22 2219

## 2022-11-02 NOTE — ED Notes (Signed)
ED Provider at bedside. 

## 2022-11-02 NOTE — Discharge Instructions (Signed)
As discussed, warm salt water rinses or half peroxide and half water rinses several times a day to the affected area.  You may also use over-the-counter Orajel applied on a wet cotton ball placed over the affected tooth.  Take the antibiotics as directed until finished.  Tylenol or ibuprofen if needed for pain.  Please call one of the dentist on the list provided to arrange follow-up appointment.

## 2022-11-02 NOTE — ED Triage Notes (Signed)
Pt endorses left sided facial pain and worried about an abscess. States she feels like its in her sinus. States there is a lump under her eye. Also reports poor dental health.
